# Patient Record
Sex: Male | Born: 2000 | Race: White | Hispanic: No | Marital: Single | Smoking: Never smoker
Health system: Southern US, Community
[De-identification: ages and names within clinical notes are randomized; demographics above are authoritative.]

## PROBLEM LIST (undated history)

## (undated) HISTORY — PX: CIRCUMCISION: SUR203

---
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
## 2019-03-07 IMAGING — CT CT HEAD W/O CM
3 series · 15 of 47 positions shown, 18 images · non-contrast
Comparison: None.

CLINICAL DATA: 17-year-old male with vertigo.

EXAM:
CT HEAD WITHOUT CONTRAST
TECHNIQUE: Contiguous axial images were obtained from the base of the skull
through the vertex without intravenous contrast.

[Series 2: head trauma wo · axial · 0.46mm/px · z∈[+1651,+1776]mm · 9 of 30 slices shown, 12 images]
[im 3/30  brain]
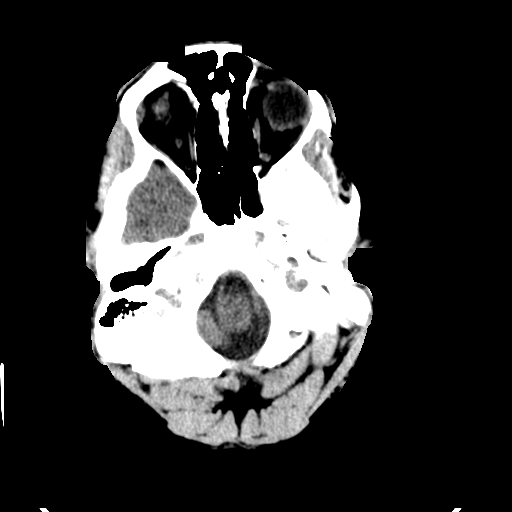
[im 3/30  bone]
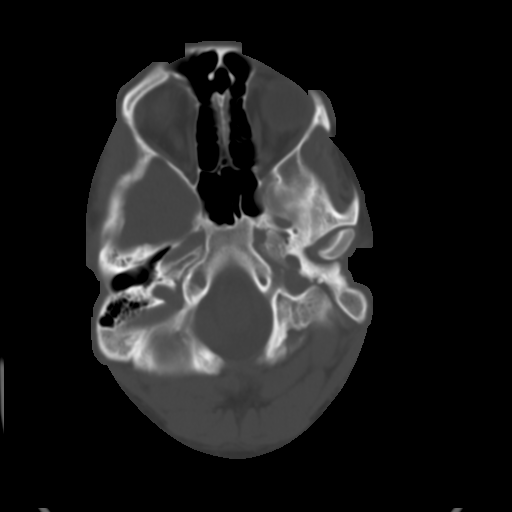
[im 6/30  brain]
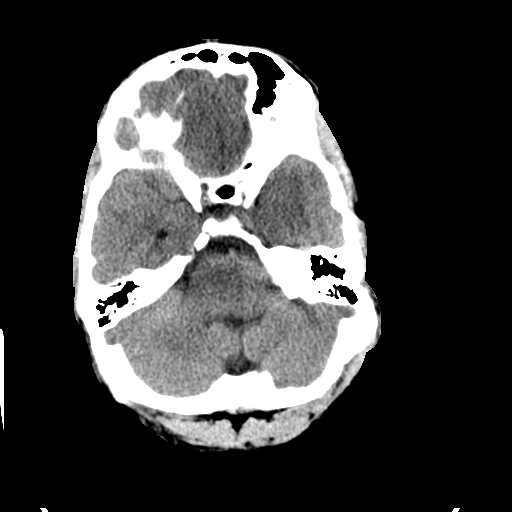
[im 9/30  brain]
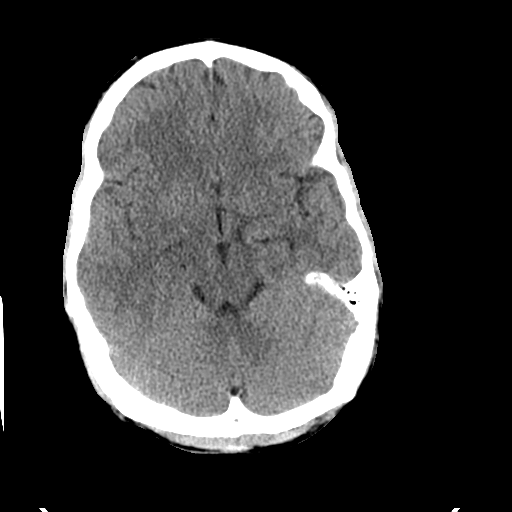
[im 12/30  brain]
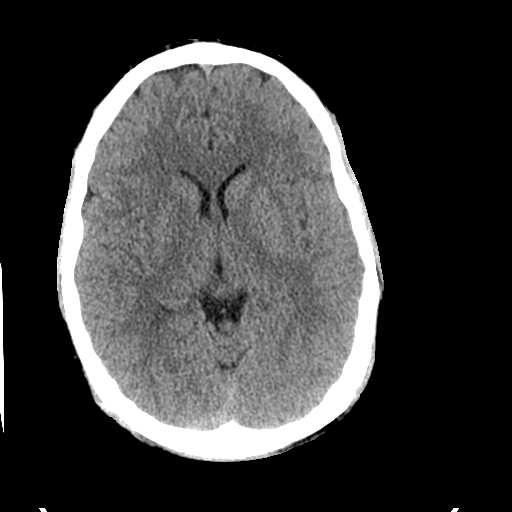
[im 16/30  brain]
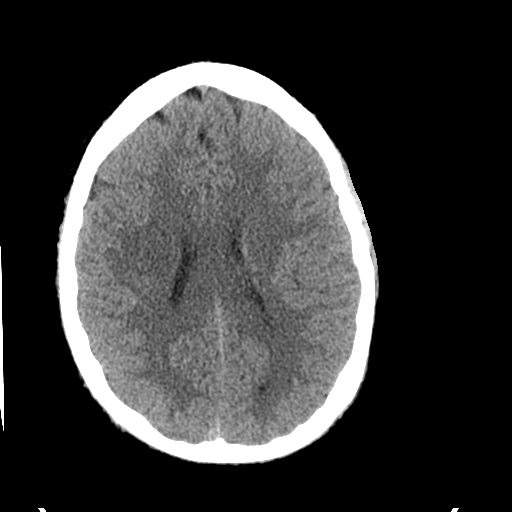
[im 16/30  bone]
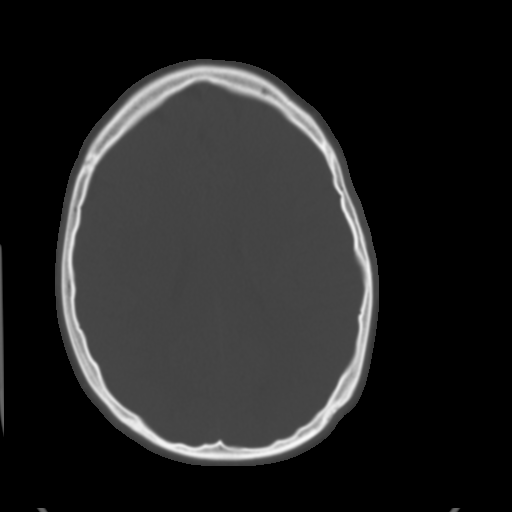
[im 19/30  brain]
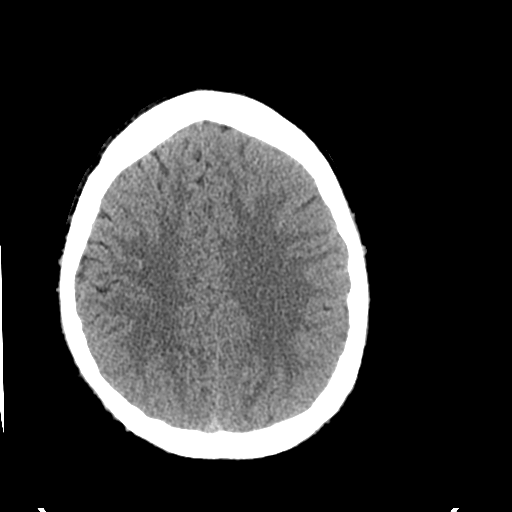
[im 22/30  brain]
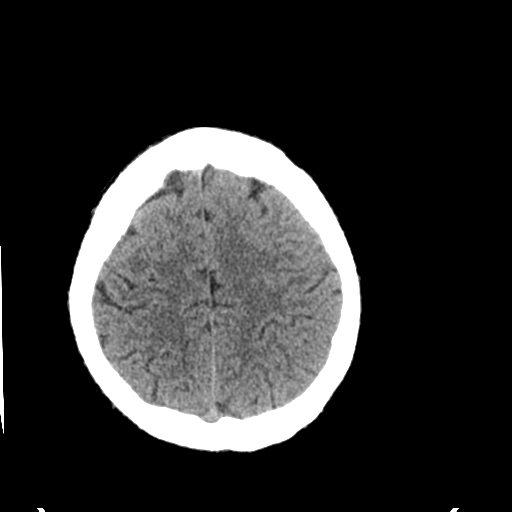
[im 25/30  brain]
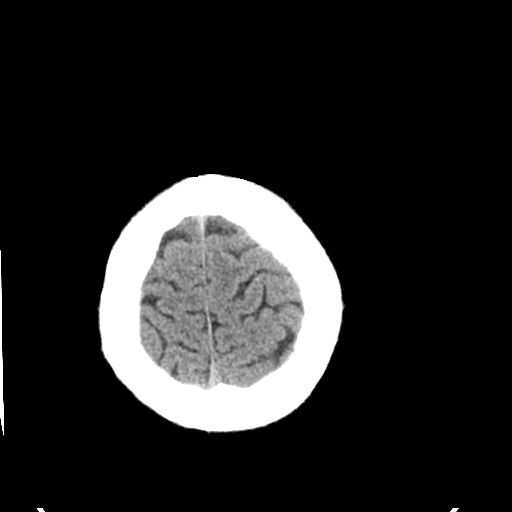
[im 28/30  brain]
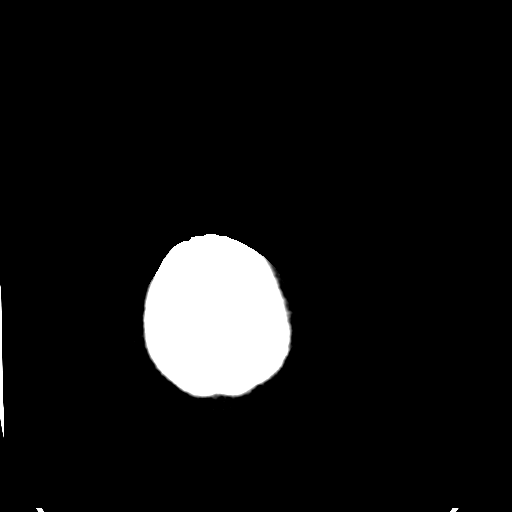
[im 28/30  bone]
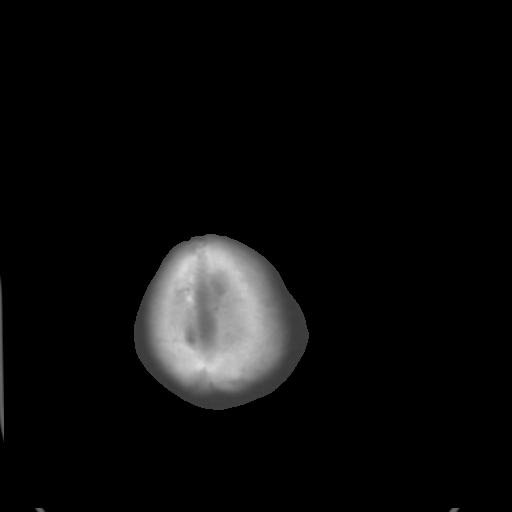

[Series 4: coronal soft tissue · coronal · 0.31mm/px · 3 of 69 slices shown]
[im 23/69  brain]
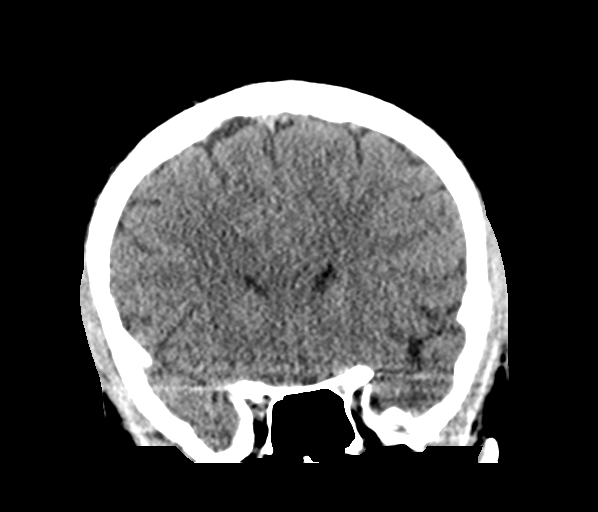
[im 31/69  brain]
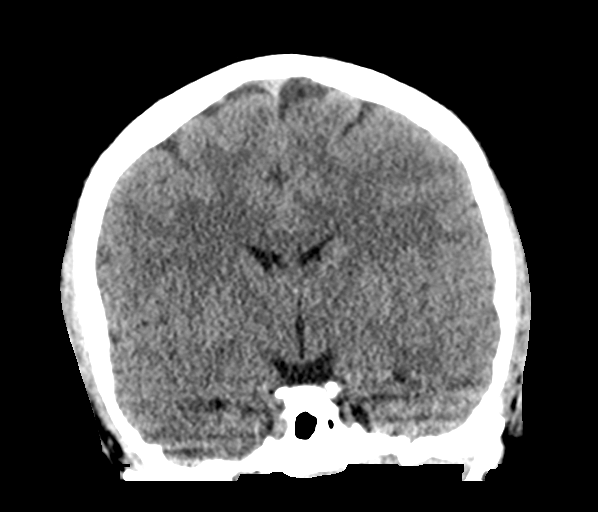
[im 38/69  brain]
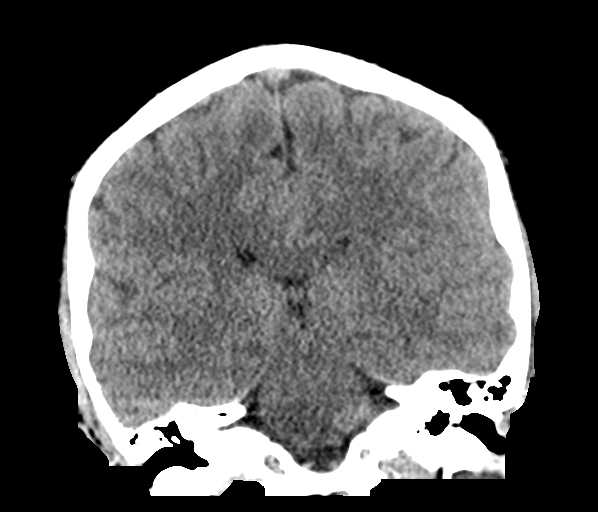

[Series 5: sagittal soft tissue · sagittal · 0.32mm/px · 3 of 55 slices shown]
[im 19/55  brain]
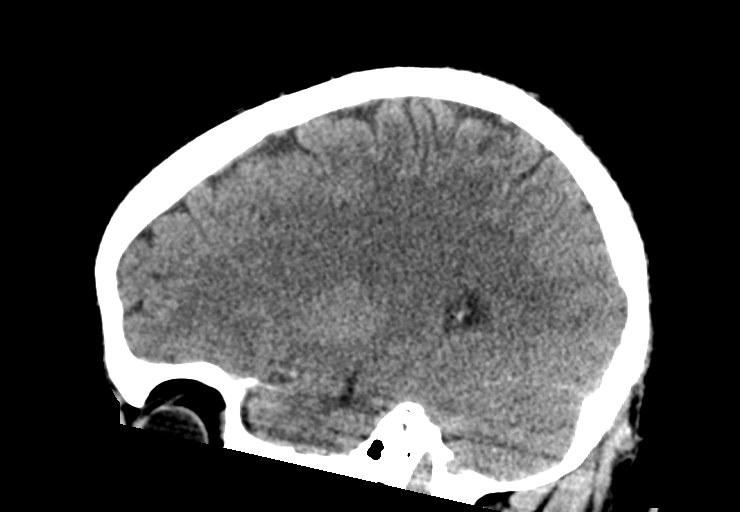
[im 28/55  brain]
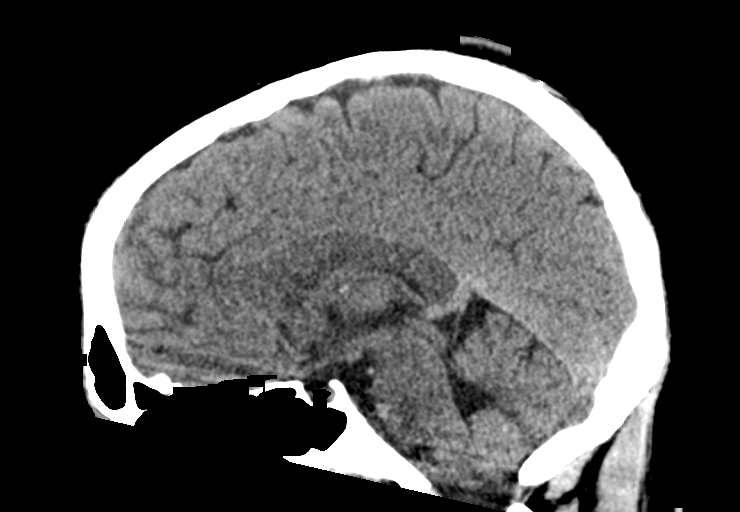
[im 37/55  brain]
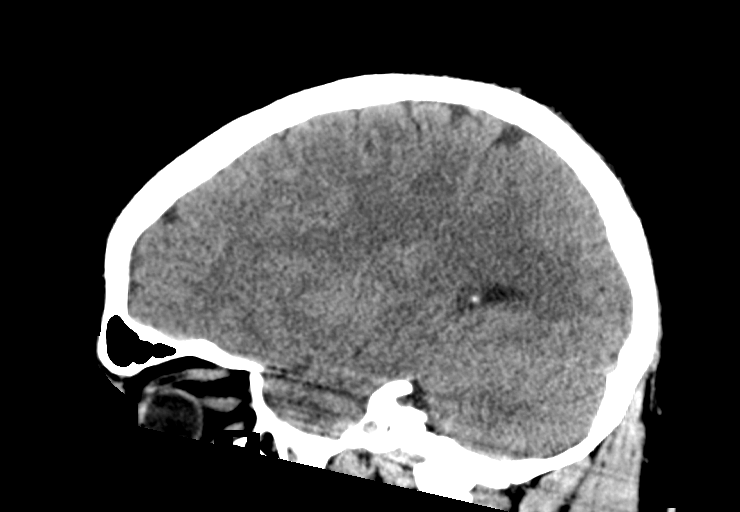

[15 of 47 positions shown; findings below may reference images not displayed]

FINDINGS: Brain: No evidence of acute infarction, hemorrhage, hydrocephalus,
extra-axial collection or mass lesion/mass effect.

Vascular: No hyperdense vessel or unexpected calcification.

Skull: Normal. Negative for fracture or focal lesion.

Sinuses/Orbits: The visualized paranasal sinuses and mastoid air
cells are clear.

Other: None
IMPRESSION: Normal noncontrast CT of the brain.
